# Patient Record
Sex: Male | Born: 1975 | Hispanic: No | Marital: Married | Smoking: Former smoker
Health system: Southern US, Community
[De-identification: ages and names within clinical notes are randomized; demographics above are authoritative.]

## PROBLEM LIST (undated history)

## (undated) DIAGNOSIS — IMO0002 Reserved for concepts with insufficient information to code with codable children: Secondary | ICD-10-CM

## (undated) HISTORY — DX: Reserved for concepts with insufficient information to code with codable children: IMO0002

## (undated) HISTORY — PX: FRACTURE SURGERY: SHX138

---

## 2014-03-30 ENCOUNTER — Ambulatory Visit (INDEPENDENT_AMBULATORY_CARE_PROVIDER_SITE_OTHER): Payer: Self-pay | Admitting: Family Medicine

## 2014-03-30 ENCOUNTER — Ambulatory Visit (INDEPENDENT_AMBULATORY_CARE_PROVIDER_SITE_OTHER): Payer: Self-pay

## 2014-03-30 VITALS — BP 142/84 | HR 68 | Temp 98.2°F | Resp 16 | Ht 73.0 in | Wt 220.0 lb

## 2014-03-30 DIAGNOSIS — M25579 Pain in unspecified ankle and joints of unspecified foot: Secondary | ICD-10-CM

## 2014-03-30 DIAGNOSIS — M25572 Pain in left ankle and joints of left foot: Secondary | ICD-10-CM

## 2014-03-30 NOTE — Patient Instructions (Signed)
You have a bad ankle sprain.  Purchase an ASO brace/bandage for your foot.  Wear this for the next 2-3 weeks, then go back to your current bandage.  Doe the circle exercises for the next 2 weeks.  Then start doing ankle lifts.  Work up to one leg on the stairs.    It was good to see you today.  Feel better.

## 2014-03-30 NOTE — Progress Notes (Signed)
George Roy is a 38 y.o. male who presents to Urgent Care today with complaints of Left ankle pain:    1.  Left ankle pain:  Started today while walking outside.  Has frequent ankle sprains for past year or so, both ankles.  Stepped in grate without seeing it.  Felt immediate pain.  No pop.  Some immediate swelling.  Able to bear weight but painful.  Taking OTC tylenol with relief.    PMH reviewed.  Past Medical History  Diagnosis Date  . Ulcer    Past Surgical History  Procedure Laterality Date  . Fracture surgery      Medications reviewed. Current Outpatient Prescriptions  Medication Sig Dispense Refill  . aspirin 81 MG tablet Take 81 mg by mouth daily.      Marland Kitchen GARLIC PO Take by mouth.       No current facility-administered medications for this visit.    ROS as above otherwise neg.    Physical Exam:  BP 142/84  Pulse 68  Temp(Src) 98.2 F (36.8 C) (Oral)  Resp 16  Ht  (1.854 m)  Wt 220 lb (99.791 kg)  BMI 29.03 kg/m2  SpO2 99% Gen:  Alert, cooperative patient who appears stated age in no acute distress.  Vital signs reviewed. MSK:  Right ankle WNL.  Left ankle with moderate swelling left lateral malleolus.  No swelling Right malleolus.  No visible bruising.  No syndesmotic pain.  Does have pain with inversion of ankle.  No pain with eversion. Able to bear weight here in clinic.  Does have good endpoints throughout of ligaments.  Ant/post drawer negative. Strength and sensation are 5/5 throughout.  Pulses good.    [facility] reading (PRIMARY) by  Dr. Gwendolyn Grant:  No tibia/fibular fracture noted.  Soft tissue swelling noted.  .  Assessment and Plan:  1.  Left ankle sprain: - moderate sprain - no signs of fracture - ASO brace -- but he is self-pay and would prefer to purchase out of pocket from [employer]. - OTC analgesics for relief. - I have showed him several ankle exercises.  See instructions.  FU in 2 weeks if no improvement, sooner if worsening.

## 2015-06-16 IMAGING — CR DG ANKLE COMPLETE 3+V*L*
2 series · 2 of 2 positions shown · non-contrast
Comparison: None.

CLINICAL DATA: Left ankle pain x 1 day

EXAM:
LEFT ANKLE COMPLETE - 3+ VIEW

[AP]
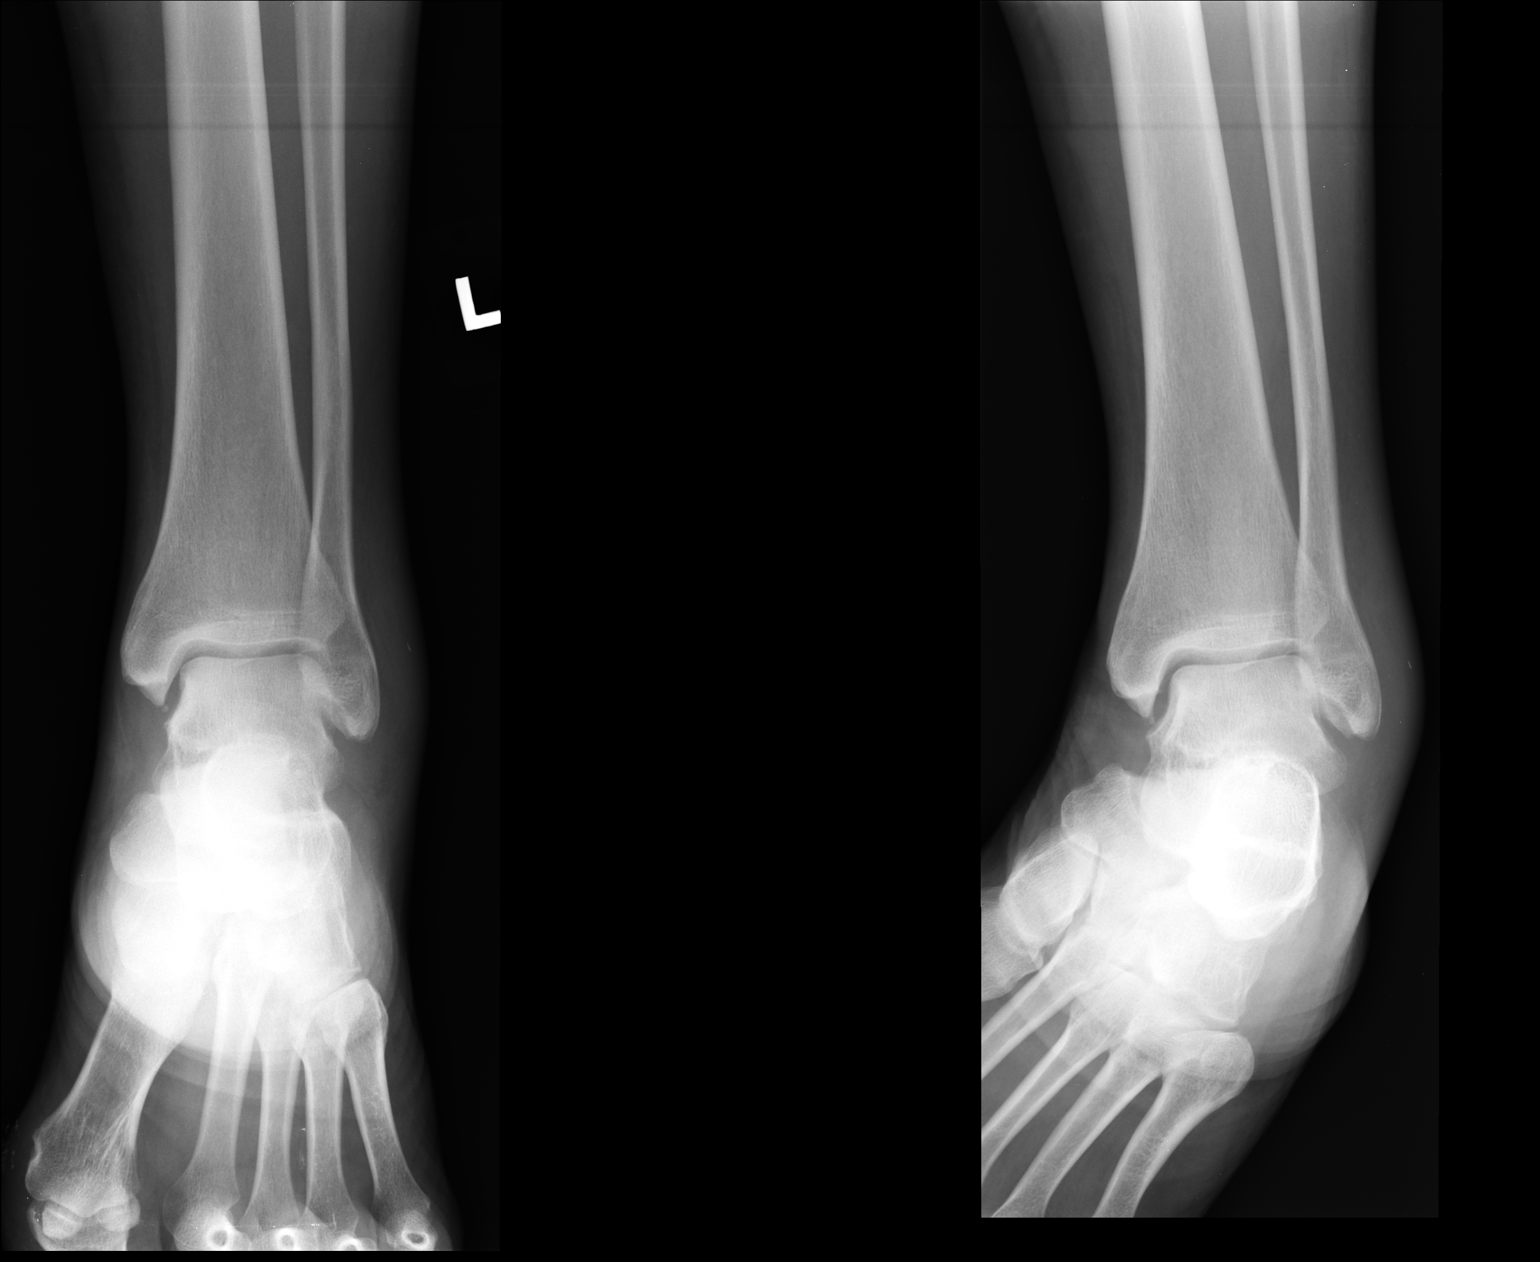

[ap obl int rot]
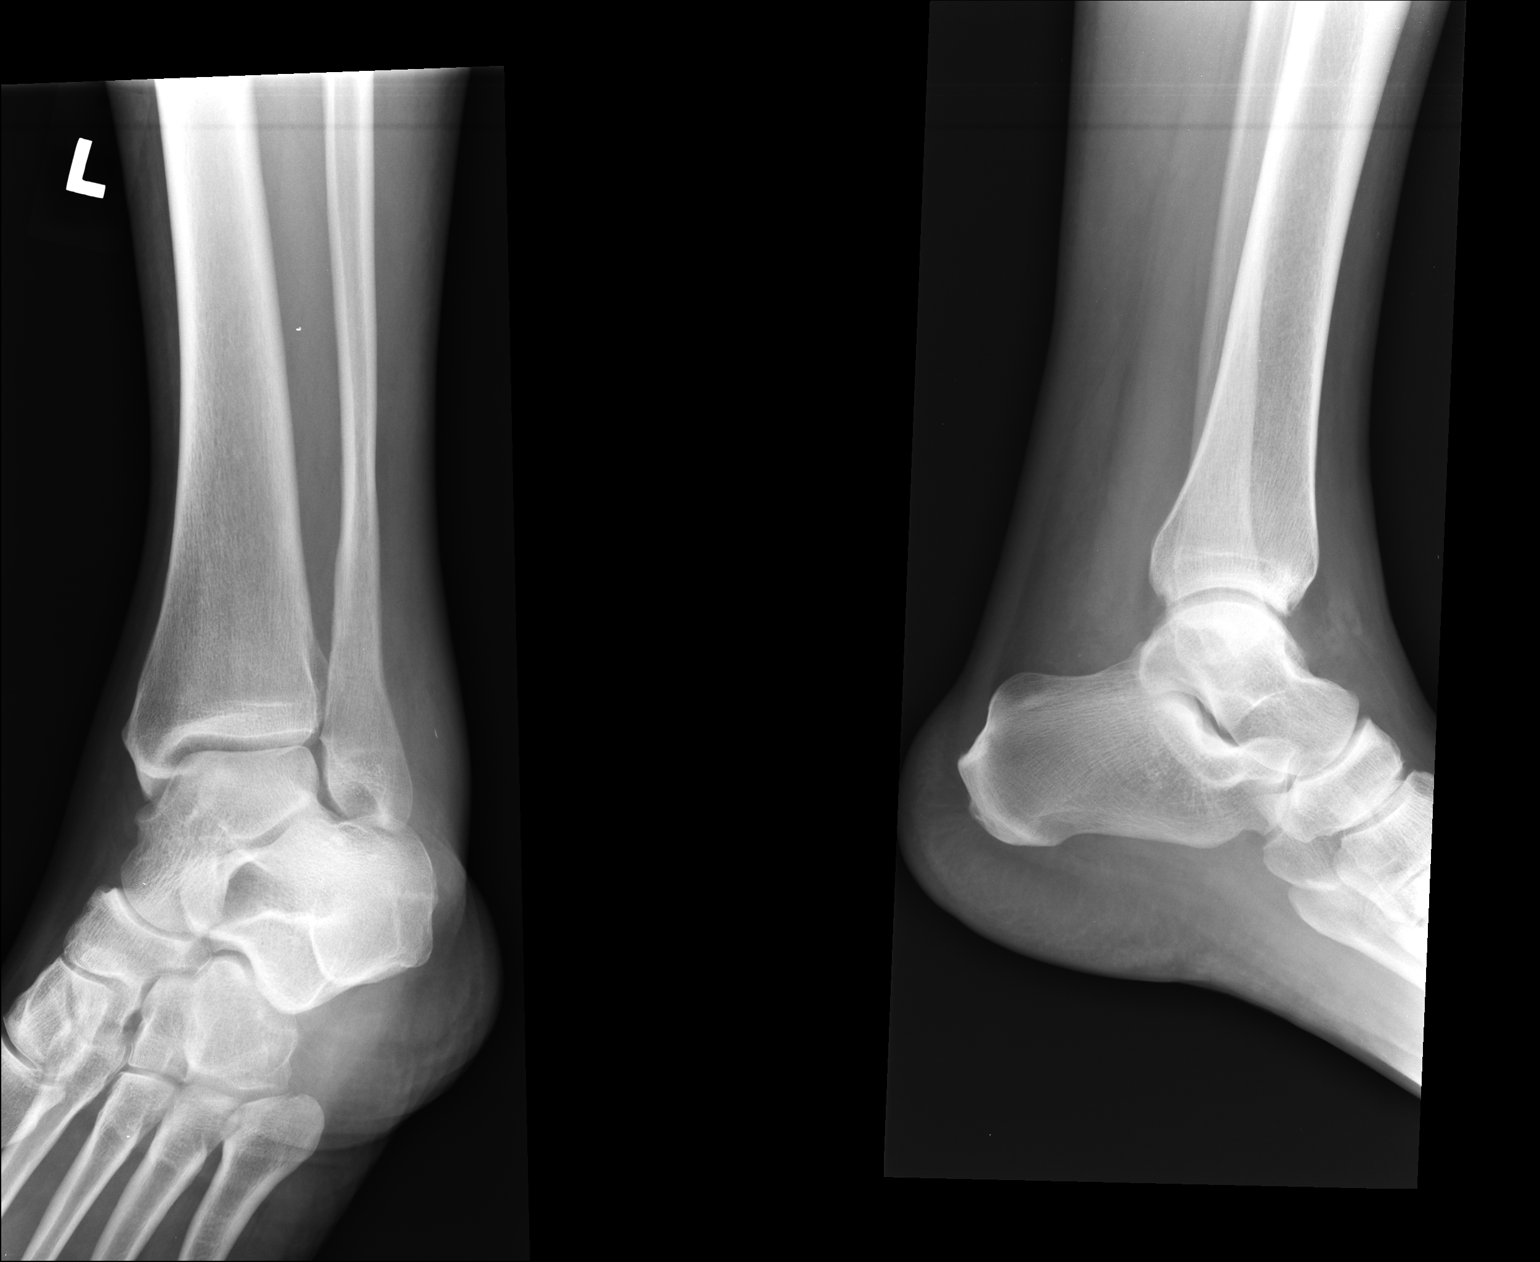

[2 of 2 positions shown; findings below may reference images not displayed]

FINDINGS: Soft tissue swelling overlies the ankle laterally. Tiny (3 mm)
linear density within the soft tissues laterally as well. Well
corticated calcific density along the tip of the medial malleolus
may reflect sequelae of remote injury. Ankle mortise intact. No
displaced acute fracture or dislocation.
IMPRESSION: Soft tissue swelling overlies the lateral malleolus. No displaced
acute fracture identified.

There is a tiny linear density within the lateral soft tissues,
which may reflect a foreign body.

These results will be called to the ordering clinician or
representative by the Radiologist Assistant, and communication
documented in the PACS or zVision Dashboard.
